# Patient Record
Sex: Female | Born: 1960
Health system: Southern US, Community
[De-identification: ages and names within clinical notes are randomized; demographics above are authoritative.]

## PROBLEM LIST (undated history)

## (undated) DIAGNOSIS — M545 Low back pain, unspecified: Secondary | ICD-10-CM

## (undated) DIAGNOSIS — N943 Premenstrual tension syndrome: Secondary | ICD-10-CM

## (undated) HISTORY — PX: CHOLECYSTECTOMY: SHX55

## (undated) HISTORY — PX: TONSILLECTOMY: SUR1361

## (undated) HISTORY — DX: Low back pain: M54.5

## (undated) HISTORY — DX: Premenstrual tension syndrome: N94.3

## (undated) HISTORY — DX: Low back pain, unspecified: M54.50

## (undated) HISTORY — PX: URETHRAL CYST REMOVAL: SHX5128

---

## 1998-03-19 ENCOUNTER — Emergency Department (HOSPITAL_COMMUNITY): Admission: EM | Admit: 1998-03-19 | Discharge: 1998-03-19 | Payer: Self-pay | Admitting: Emergency Medicine

## 1998-10-08 ENCOUNTER — Encounter: Payer: Self-pay | Admitting: Emergency Medicine

## 1998-10-08 ENCOUNTER — Emergency Department (HOSPITAL_COMMUNITY): Admission: EM | Admit: 1998-10-08 | Discharge: 1998-10-08 | Payer: Self-pay | Admitting: Emergency Medicine

## 2002-04-16 ENCOUNTER — Other Ambulatory Visit: Admission: RE | Admit: 2002-04-16 | Discharge: 2002-04-16 | Payer: Self-pay | Admitting: Obstetrics & Gynecology

## 2003-05-21 ENCOUNTER — Other Ambulatory Visit: Admission: RE | Admit: 2003-05-21 | Discharge: 2003-05-21 | Payer: Self-pay | Admitting: Obstetrics & Gynecology

## 2004-08-05 ENCOUNTER — Other Ambulatory Visit: Admission: RE | Admit: 2004-08-05 | Discharge: 2004-08-05 | Payer: Self-pay | Admitting: Obstetrics & Gynecology

## 2005-04-07 ENCOUNTER — Encounter: Admission: RE | Admit: 2005-04-07 | Discharge: 2005-04-07 | Payer: Self-pay | Admitting: Obstetrics & Gynecology

## 2006-08-03 ENCOUNTER — Encounter: Admission: RE | Admit: 2006-08-03 | Discharge: 2006-08-03 | Payer: Self-pay | Admitting: Internal Medicine

## 2011-07-13 ENCOUNTER — Ambulatory Visit (AMBULATORY_SURGERY_CENTER): Payer: BC Managed Care – PPO

## 2011-07-13 ENCOUNTER — Encounter: Payer: Self-pay | Admitting: Internal Medicine

## 2011-07-13 VITALS — Ht 63.0 in | Wt 166.7 lb

## 2011-07-13 DIAGNOSIS — Z1211 Encounter for screening for malignant neoplasm of colon: Secondary | ICD-10-CM

## 2011-07-13 DIAGNOSIS — K921 Melena: Secondary | ICD-10-CM

## 2011-07-13 MED ORDER — PEG-KCL-NACL-NASULF-NA ASC-C 100 G PO SOLR: 1.0000 | Freq: Once | ORAL | Status: AC

## 2011-07-27 ENCOUNTER — Encounter: Payer: Self-pay | Admitting: Internal Medicine

## 2011-08-04 ENCOUNTER — Encounter: Payer: Self-pay | Admitting: Internal Medicine

## 2011-08-04 ENCOUNTER — Ambulatory Visit (AMBULATORY_SURGERY_CENTER): Payer: BC Managed Care – PPO | Admitting: Internal Medicine

## 2011-08-04 VITALS — BP 133/80 | HR 71 | Temp 99.7°F | Resp 20 | Ht 63.0 in | Wt 166.0 lb

## 2011-08-04 DIAGNOSIS — Z1211 Encounter for screening for malignant neoplasm of colon: Secondary | ICD-10-CM

## 2011-08-04 DIAGNOSIS — K921 Melena: Secondary | ICD-10-CM

## 2011-08-04 DIAGNOSIS — J982 Interstitial emphysema: Secondary | ICD-10-CM

## 2011-08-04 MED ORDER — SODIUM CHLORIDE 0.9 % IV SOLN: 500.0000 mL | INTRAVENOUS | Status: DC

## 2011-08-04 NOTE — Patient Instructions (Addendum)
Internal hemorrhoids, otherwise normal exam  High fiber diet  Recall colonoscopy in 10 yearsYOU HAD AN ENDOSCOPIC PROCEDURE TODAY AT THE [facility]: Refer to the procedure report that was given to you for any specific questions about what was found during the examination.  If the procedure report does not answer your questions, please call your gastroenterologist to clarify.  If you requested that your care partner not be given the details of your procedure findings, then the procedure report has been included in a sealed envelope for you to review at your convenience later.  YOU SHOULD EXPECT: Some feelings of bloating in the abdomen. Passage of more gas than usual.  Walking can help get rid of the air that was put into your GI tract during the procedure and reduce the bloating. If you had a lower endoscopy (such as a colonoscopy or flexible sigmoidoscopy) you may notice spotting of blood in your stool or on the toilet paper. If you underwent a bowel prep for your procedure, then you may not have a normal bowel movement for a few days.  DIET: Your first meal following the procedure should be a light meal and then it is ok to progress to your normal diet.  A half-sandwich or bowl of soup is an example of a good first meal.  Heavy or fried foods are harder to digest and may make you feel nauseous or bloated.  Likewise meals heavy in dairy and vegetables can cause extra gas to form and this can also increase the bloating.  Drink plenty of fluids but you should avoid alcoholic beverages for 24 hours.  ACTIVITY: Your care partner should take you home directly after the procedure.  You should plan to take it easy, moving slowly for the rest of the day.  You can resume normal activity the day after the procedure however you should NOT DRIVE or use heavy machinery for 24 hours (because of the sedation medicines used during the test).    SYMPTOMS TO REPORT IMMEDIATELY: A gastroenterologist can be  reached at any hour.  During normal business hours, 8:30 AM to 5:00 PM Monday through Friday, call [phone].  After hours and on weekends, please call the GI answering service at [phone] who will take a message and have the physician on call contact you.   Following lower endoscopy (colonoscopy or flexible sigmoidoscopy):  Excessive amounts of blood in the stool  Significant tenderness or worsening of abdominal pains  Swelling of the abdomen that is new, acute  Fever of 100F or higher   FOLLOW UP: If any biopsies were taken you will be contacted by phone or by letter within the next 1-3 weeks.  Call your gastroenterologist if you have not heard about the biopsies in 3 weeks.  Our staff will call the home number listed on your records the next business day following your procedure to check on you and address any questions or concerns that you may have at that time regarding the information given to you following your procedure. This is a courtesy call and so if there is no answer at the home number and we have not heard from you through the emergency physician on call, we will assume that you have returned to your regular daily activities without incident.  SIGNATURES/CONFIDENTIALITY: You and/or your care partner have signed paperwork which will be entered into your electronic medical record.  These signatures attest to the fact that that the information above on your After Visit  Summary has been reviewed and is understood.  Full responsibility of the confidentiality of this discharge information lies with you and/or your care-partner.  

## 2011-08-04 NOTE — Progress Notes (Signed)
Patient did not experience any of the following events: a burn prior to discharge; a fall within the facility; wrong site/side/patient/procedure/implant event; or a hospital transfer or hospital admission upon discharge from the facility. (G8907) Patient did not have preoperative order for IV antibiotic SSI prophylaxis. (G8918)  

## 2011-08-04 NOTE — Op Note (Signed)
[facility] [address] [place], [place]  [address]  COLONOSCOPY PROCEDURE REPORT  PATIENT:  Madeline Arias, Madeline Arias  MR#:  [id number] BIRTHDATE:  Sep 15, 1960, 50 yrs. old  GENDER:  female ENDOSCOPIST:  Hedwig Morton. Juanda Chance, MD REF. BY:  Geoffry Paradise, M.D. PROCEDURE DATE:  08/04/2011 PROCEDURE:  Colonoscopy 19147 ASA CLASS:  Class I INDICATIONS:  colorectal cancer screening, average risk, rectal bleeding MEDICATIONS:   MAC sedation, administered by CRNA, Fentanyl 200 mg  DESCRIPTION OF PROCEDURE:   After the risks and benefits and of the procedure were explained, informed consent was obtained. Digital rectal exam was performed and revealed no rectal masses. The LB CF-[id number] endoscope was introduced through the anus and advanced to the cecum, which was identified by both the appendix and ileocecal valve.  The quality of the prep was good, using MoviPrep.  The instrument was then slowly withdrawn as the colon was fully examined. <<PROCEDUREIMAGES>>  FINDINGS:  Internal Hemorrhoids were found. small int (see image4). hems  This was otherwise a normal examination of the colon (see image3, image2, and image1).   Retroflexed views in the rectum revealed no abnormalities.    The scope was then withdrawn from the patient and the procedure completed.  COMPLICATIONS:  None ENDOSCOPIC IMPRESSION: 1) Internal hemorrhoids 2) Otherwise normal examination RECOMMENDATIONS: 1) High fiber diet.  REPEAT EXAM:  In 10 year(s) for.  ______________________________ Hedwig Morton. Juanda Chance, MD  CC:  Varney Baas, MD  n. Rosalie DoctorHedwig Morton. Jerrin Recore at 08/04/2011 12:20 PM  [place], [phone]

## 2011-08-05 ENCOUNTER — Telehealth: Payer: Self-pay | Admitting: *Deleted

## 2011-08-05 NOTE — Telephone Encounter (Signed)
  Follow up Call-  Call back number 08/04/2011  Post procedure Call Back phone  # [phone]  Permission to leave phone message Yes     Patient questions:  Do you have a fever, pain , or abdominal swelling? no Pain Score  0 *  Have you tolerated food without any problems? yes  Have you been able to return to your normal activities? yes  Do you have any questions about your discharge instructions: Diet   no Medications  no Follow up visit  no  Do you have questions or concerns about your Care? no  Actions: * If pain score is 4 or above: No action needed, pain <4.

## 2012-07-04 ENCOUNTER — Other Ambulatory Visit: Payer: Self-pay | Admitting: Obstetrics & Gynecology

## 2012-07-04 DIAGNOSIS — R928 Other abnormal and inconclusive findings on diagnostic imaging of breast: Secondary | ICD-10-CM

## 2012-07-11 ENCOUNTER — Ambulatory Visit
Admission: RE | Admit: 2012-07-11 | Discharge: 2012-07-11 | Disposition: A | Discharge status: Home / Self Care | Payer: BC Managed Care – PPO | Source: Ambulatory Visit | Attending: Obstetrics & Gynecology | Admitting: Obstetrics & Gynecology

## 2012-07-11 DIAGNOSIS — R928 Other abnormal and inconclusive findings on diagnostic imaging of breast: Secondary | ICD-10-CM

## 2015-09-19 DIAGNOSIS — E559 Vitamin D deficiency, unspecified: Secondary | ICD-10-CM | POA: Diagnosis not present

## 2015-09-19 DIAGNOSIS — K219 Gastro-esophageal reflux disease without esophagitis: Secondary | ICD-10-CM | POA: Diagnosis not present

## 2015-09-19 DIAGNOSIS — R0602 Shortness of breath: Secondary | ICD-10-CM | POA: Diagnosis not present

## 2015-09-19 DIAGNOSIS — R5383 Other fatigue: Secondary | ICD-10-CM | POA: Diagnosis not present

## 2015-10-20 DIAGNOSIS — Z01419 Encounter for gynecological examination (general) (routine) without abnormal findings: Secondary | ICD-10-CM | POA: Diagnosis not present

## 2015-10-20 DIAGNOSIS — Z6828 Body mass index (BMI) 28.0-28.9, adult: Secondary | ICD-10-CM | POA: Diagnosis not present

## 2015-10-28 DIAGNOSIS — N390 Urinary tract infection, site not specified: Secondary | ICD-10-CM | POA: Diagnosis not present

## 2015-10-28 DIAGNOSIS — N760 Acute vaginitis: Secondary | ICD-10-CM | POA: Diagnosis not present

## 2015-11-13 DIAGNOSIS — Z1231 Encounter for screening mammogram for malignant neoplasm of breast: Secondary | ICD-10-CM | POA: Diagnosis not present

## 2016-07-14 DIAGNOSIS — Z1329 Encounter for screening for other suspected endocrine disorder: Secondary | ICD-10-CM | POA: Diagnosis not present

## 2016-07-14 DIAGNOSIS — Z780 Asymptomatic menopausal state: Secondary | ICD-10-CM | POA: Diagnosis not present

## 2016-07-14 DIAGNOSIS — B373 Candidiasis of vulva and vagina: Secondary | ICD-10-CM | POA: Diagnosis not present

## 2016-07-15 DIAGNOSIS — H40013 Open angle with borderline findings, low risk, bilateral: Secondary | ICD-10-CM | POA: Diagnosis not present

## 2016-07-15 DIAGNOSIS — H524 Presbyopia: Secondary | ICD-10-CM | POA: Diagnosis not present

## 2016-07-15 DIAGNOSIS — H10413 Chronic giant papillary conjunctivitis, bilateral: Secondary | ICD-10-CM | POA: Diagnosis not present

## 2016-07-15 DIAGNOSIS — H2513 Age-related nuclear cataract, bilateral: Secondary | ICD-10-CM | POA: Diagnosis not present

## 2016-07-27 DIAGNOSIS — E559 Vitamin D deficiency, unspecified: Secondary | ICD-10-CM | POA: Diagnosis not present

## 2016-07-27 DIAGNOSIS — E784 Other hyperlipidemia: Secondary | ICD-10-CM | POA: Diagnosis not present

## 2016-07-27 DIAGNOSIS — Z0000 Encounter for general adult medical examination without abnormal findings: Secondary | ICD-10-CM | POA: Diagnosis not present

## 2016-08-02 DIAGNOSIS — Z0000 Encounter for general adult medical examination without abnormal findings: Secondary | ICD-10-CM | POA: Diagnosis not present

## 2016-08-02 DIAGNOSIS — E663 Overweight: Secondary | ICD-10-CM | POA: Diagnosis not present

## 2016-08-02 DIAGNOSIS — R5381 Other malaise: Secondary | ICD-10-CM | POA: Diagnosis not present

## 2016-08-02 DIAGNOSIS — Z1389 Encounter for screening for other disorder: Secondary | ICD-10-CM | POA: Diagnosis not present

## 2016-08-02 DIAGNOSIS — M62830 Muscle spasm of back: Secondary | ICD-10-CM | POA: Diagnosis not present

## 2016-08-02 DIAGNOSIS — E559 Vitamin D deficiency, unspecified: Secondary | ICD-10-CM | POA: Diagnosis not present

## 2016-08-05 ENCOUNTER — Other Ambulatory Visit: Payer: Self-pay | Admitting: Internal Medicine

## 2016-08-05 DIAGNOSIS — R10816 Epigastric abdominal tenderness: Secondary | ICD-10-CM

## 2016-08-10 ENCOUNTER — Ambulatory Visit
Admission: RE | Admit: 2016-08-10 | Discharge: 2016-08-10 | Disposition: A | Discharge status: Home / Self Care | Payer: BLUE CROSS/BLUE SHIELD | Source: Ambulatory Visit | Attending: Internal Medicine | Admitting: Internal Medicine

## 2016-08-10 DIAGNOSIS — R10816 Epigastric abdominal tenderness: Secondary | ICD-10-CM

## 2016-08-10 DIAGNOSIS — R10819 Abdominal tenderness, unspecified site: Secondary | ICD-10-CM | POA: Diagnosis not present

## 2016-11-22 DIAGNOSIS — Z6827 Body mass index (BMI) 27.0-27.9, adult: Secondary | ICD-10-CM | POA: Diagnosis not present

## 2016-11-22 DIAGNOSIS — Z01419 Encounter for gynecological examination (general) (routine) without abnormal findings: Secondary | ICD-10-CM | POA: Diagnosis not present

## 2016-11-22 DIAGNOSIS — Z780 Asymptomatic menopausal state: Secondary | ICD-10-CM | POA: Diagnosis not present

## 2016-11-22 DIAGNOSIS — Z1231 Encounter for screening mammogram for malignant neoplasm of breast: Secondary | ICD-10-CM | POA: Diagnosis not present

## 2017-01-20 DIAGNOSIS — H2513 Age-related nuclear cataract, bilateral: Secondary | ICD-10-CM | POA: Diagnosis not present

## 2017-01-20 DIAGNOSIS — H40013 Open angle with borderline findings, low risk, bilateral: Secondary | ICD-10-CM | POA: Diagnosis not present

## 2017-01-20 DIAGNOSIS — H10413 Chronic giant papillary conjunctivitis, bilateral: Secondary | ICD-10-CM | POA: Diagnosis not present

## 2017-06-08 DIAGNOSIS — N390 Urinary tract infection, site not specified: Secondary | ICD-10-CM | POA: Diagnosis not present

## 2017-06-08 DIAGNOSIS — N760 Acute vaginitis: Secondary | ICD-10-CM | POA: Diagnosis not present

## 2017-07-14 DIAGNOSIS — N390 Urinary tract infection, site not specified: Secondary | ICD-10-CM | POA: Diagnosis not present

## 2017-07-14 DIAGNOSIS — N760 Acute vaginitis: Secondary | ICD-10-CM | POA: Diagnosis not present

## 2017-08-01 DIAGNOSIS — E7849 Other hyperlipidemia: Secondary | ICD-10-CM | POA: Diagnosis not present

## 2017-08-01 DIAGNOSIS — E559 Vitamin D deficiency, unspecified: Secondary | ICD-10-CM | POA: Diagnosis not present

## 2017-08-01 DIAGNOSIS — Z0000 Encounter for general adult medical examination without abnormal findings: Secondary | ICD-10-CM | POA: Diagnosis not present

## 2017-08-08 DIAGNOSIS — M62830 Muscle spasm of back: Secondary | ICD-10-CM | POA: Diagnosis not present

## 2017-08-08 DIAGNOSIS — Z1389 Encounter for screening for other disorder: Secondary | ICD-10-CM | POA: Diagnosis not present

## 2017-08-08 DIAGNOSIS — E559 Vitamin D deficiency, unspecified: Secondary | ICD-10-CM | POA: Diagnosis not present

## 2017-08-08 DIAGNOSIS — Z0000 Encounter for general adult medical examination without abnormal findings: Secondary | ICD-10-CM | POA: Diagnosis not present

## 2017-08-08 DIAGNOSIS — E663 Overweight: Secondary | ICD-10-CM | POA: Diagnosis not present

## 2017-08-08 DIAGNOSIS — E7849 Other hyperlipidemia: Secondary | ICD-10-CM | POA: Diagnosis not present

## 2017-08-11 DIAGNOSIS — H40013 Open angle with borderline findings, low risk, bilateral: Secondary | ICD-10-CM | POA: Diagnosis not present

## 2017-08-11 DIAGNOSIS — H10413 Chronic giant papillary conjunctivitis, bilateral: Secondary | ICD-10-CM | POA: Diagnosis not present

## 2017-08-11 DIAGNOSIS — H2513 Age-related nuclear cataract, bilateral: Secondary | ICD-10-CM | POA: Diagnosis not present

## 2017-08-22 DIAGNOSIS — J029 Acute pharyngitis, unspecified: Secondary | ICD-10-CM | POA: Diagnosis not present

## 2017-08-22 DIAGNOSIS — Z6827 Body mass index (BMI) 27.0-27.9, adult: Secondary | ICD-10-CM | POA: Diagnosis not present

## 2017-08-22 DIAGNOSIS — J069 Acute upper respiratory infection, unspecified: Secondary | ICD-10-CM | POA: Diagnosis not present

## 2017-11-08 DIAGNOSIS — N368 Other specified disorders of urethra: Secondary | ICD-10-CM | POA: Diagnosis not present

## 2017-11-28 DIAGNOSIS — Z01419 Encounter for gynecological examination (general) (routine) without abnormal findings: Secondary | ICD-10-CM | POA: Diagnosis not present

## 2017-11-28 DIAGNOSIS — Z6827 Body mass index (BMI) 27.0-27.9, adult: Secondary | ICD-10-CM | POA: Diagnosis not present

## 2017-11-28 DIAGNOSIS — Z1231 Encounter for screening mammogram for malignant neoplasm of breast: Secondary | ICD-10-CM | POA: Diagnosis not present

## 2017-12-28 DIAGNOSIS — M79641 Pain in right hand: Secondary | ICD-10-CM | POA: Diagnosis not present

## 2018-01-30 DIAGNOSIS — H40013 Open angle with borderline findings, low risk, bilateral: Secondary | ICD-10-CM | POA: Diagnosis not present

## 2018-01-30 DIAGNOSIS — H10413 Chronic giant papillary conjunctivitis, bilateral: Secondary | ICD-10-CM | POA: Diagnosis not present

## 2018-01-30 DIAGNOSIS — H2513 Age-related nuclear cataract, bilateral: Secondary | ICD-10-CM | POA: Diagnosis not present

## 2018-08-07 DIAGNOSIS — E7849 Other hyperlipidemia: Secondary | ICD-10-CM | POA: Diagnosis not present

## 2018-08-07 DIAGNOSIS — E559 Vitamin D deficiency, unspecified: Secondary | ICD-10-CM | POA: Diagnosis not present

## 2018-08-07 DIAGNOSIS — Z0000 Encounter for general adult medical examination without abnormal findings: Secondary | ICD-10-CM | POA: Diagnosis not present

## 2018-08-14 DIAGNOSIS — E663 Overweight: Secondary | ICD-10-CM | POA: Diagnosis not present

## 2018-08-14 DIAGNOSIS — M1990 Unspecified osteoarthritis, unspecified site: Secondary | ICD-10-CM | POA: Diagnosis not present

## 2018-08-14 DIAGNOSIS — Z1331 Encounter for screening for depression: Secondary | ICD-10-CM | POA: Diagnosis not present

## 2018-08-14 DIAGNOSIS — E785 Hyperlipidemia, unspecified: Secondary | ICD-10-CM | POA: Diagnosis not present

## 2018-08-14 DIAGNOSIS — Z0000 Encounter for general adult medical examination without abnormal findings: Secondary | ICD-10-CM | POA: Diagnosis not present

## 2018-11-13 DIAGNOSIS — H2513 Age-related nuclear cataract, bilateral: Secondary | ICD-10-CM | POA: Diagnosis not present

## 2018-11-13 DIAGNOSIS — H40053 Ocular hypertension, bilateral: Secondary | ICD-10-CM | POA: Diagnosis not present

## 2018-11-13 DIAGNOSIS — H40023 Open angle with borderline findings, high risk, bilateral: Secondary | ICD-10-CM | POA: Diagnosis not present

## 2018-11-13 DIAGNOSIS — H10413 Chronic giant papillary conjunctivitis, bilateral: Secondary | ICD-10-CM | POA: Diagnosis not present

## 2019-01-15 DIAGNOSIS — Z01419 Encounter for gynecological examination (general) (routine) without abnormal findings: Secondary | ICD-10-CM | POA: Diagnosis not present

## 2019-01-15 DIAGNOSIS — Z1231 Encounter for screening mammogram for malignant neoplasm of breast: Secondary | ICD-10-CM | POA: Diagnosis not present

## 2019-01-15 DIAGNOSIS — Z6829 Body mass index (BMI) 29.0-29.9, adult: Secondary | ICD-10-CM | POA: Diagnosis not present

## 2019-01-18 DIAGNOSIS — R0602 Shortness of breath: Secondary | ICD-10-CM | POA: Diagnosis not present

## 2019-01-18 DIAGNOSIS — E785 Hyperlipidemia, unspecified: Secondary | ICD-10-CM | POA: Diagnosis not present

## 2019-01-18 DIAGNOSIS — R0789 Other chest pain: Secondary | ICD-10-CM | POA: Diagnosis not present

## 2019-01-18 DIAGNOSIS — E663 Overweight: Secondary | ICD-10-CM | POA: Diagnosis not present

## 2019-01-25 ENCOUNTER — Other Ambulatory Visit: Payer: Self-pay | Admitting: Internal Medicine

## 2019-01-25 DIAGNOSIS — Z0000 Encounter for general adult medical examination without abnormal findings: Secondary | ICD-10-CM

## 2019-01-25 DIAGNOSIS — E785 Hyperlipidemia, unspecified: Secondary | ICD-10-CM

## 2019-01-27 DIAGNOSIS — Z23 Encounter for immunization: Secondary | ICD-10-CM | POA: Diagnosis not present

## 2019-01-31 ENCOUNTER — Ambulatory Visit
Admission: RE | Admit: 2019-01-31 | Discharge: 2019-01-31 | Disposition: A | Discharge status: Home / Self Care | Payer: No Typology Code available for payment source | Source: Ambulatory Visit | Attending: Internal Medicine | Admitting: Internal Medicine

## 2019-01-31 DIAGNOSIS — Z0000 Encounter for general adult medical examination without abnormal findings: Secondary | ICD-10-CM

## 2019-01-31 DIAGNOSIS — E785 Hyperlipidemia, unspecified: Secondary | ICD-10-CM

## 2019-05-03 DIAGNOSIS — H40053 Ocular hypertension, bilateral: Secondary | ICD-10-CM | POA: Diagnosis not present

## 2019-05-03 DIAGNOSIS — H2513 Age-related nuclear cataract, bilateral: Secondary | ICD-10-CM | POA: Diagnosis not present

## 2019-05-03 DIAGNOSIS — H40023 Open angle with borderline findings, high risk, bilateral: Secondary | ICD-10-CM | POA: Diagnosis not present

## 2019-05-03 DIAGNOSIS — H10413 Chronic giant papillary conjunctivitis, bilateral: Secondary | ICD-10-CM | POA: Diagnosis not present

## 2019-07-20 DIAGNOSIS — Z23 Encounter for immunization: Secondary | ICD-10-CM | POA: Diagnosis not present

## 2019-08-10 DIAGNOSIS — Z23 Encounter for immunization: Secondary | ICD-10-CM | POA: Diagnosis not present

## 2019-09-07 DIAGNOSIS — N760 Acute vaginitis: Secondary | ICD-10-CM | POA: Diagnosis not present

## 2019-09-07 DIAGNOSIS — N951 Menopausal and female climacteric states: Secondary | ICD-10-CM | POA: Diagnosis not present

## 2019-09-07 DIAGNOSIS — N939 Abnormal uterine and vaginal bleeding, unspecified: Secondary | ICD-10-CM | POA: Diagnosis not present

## 2019-12-10 DIAGNOSIS — H40053 Ocular hypertension, bilateral: Secondary | ICD-10-CM | POA: Diagnosis not present

## 2019-12-10 DIAGNOSIS — H2513 Age-related nuclear cataract, bilateral: Secondary | ICD-10-CM | POA: Diagnosis not present

## 2019-12-10 DIAGNOSIS — H40023 Open angle with borderline findings, high risk, bilateral: Secondary | ICD-10-CM | POA: Diagnosis not present

## 2019-12-10 DIAGNOSIS — H10413 Chronic giant papillary conjunctivitis, bilateral: Secondary | ICD-10-CM | POA: Diagnosis not present

## 2019-12-26 DIAGNOSIS — Z20828 Contact with and (suspected) exposure to other viral communicable diseases: Secondary | ICD-10-CM | POA: Diagnosis not present

## 2020-01-17 DIAGNOSIS — Z01419 Encounter for gynecological examination (general) (routine) without abnormal findings: Secondary | ICD-10-CM | POA: Diagnosis not present

## 2020-01-17 DIAGNOSIS — Z6830 Body mass index (BMI) 30.0-30.9, adult: Secondary | ICD-10-CM | POA: Diagnosis not present

## 2020-01-25 DIAGNOSIS — Z1231 Encounter for screening mammogram for malignant neoplasm of breast: Secondary | ICD-10-CM | POA: Diagnosis not present

## 2020-01-31 DIAGNOSIS — R1310 Dysphagia, unspecified: Secondary | ICD-10-CM | POA: Diagnosis not present

## 2020-02-04 DIAGNOSIS — E785 Hyperlipidemia, unspecified: Secondary | ICD-10-CM | POA: Diagnosis not present

## 2020-02-04 DIAGNOSIS — E559 Vitamin D deficiency, unspecified: Secondary | ICD-10-CM | POA: Diagnosis not present

## 2020-06-03 DIAGNOSIS — Z03818 Encounter for observation for suspected exposure to other biological agents ruled out: Secondary | ICD-10-CM | POA: Diagnosis not present

## 2020-06-10 DIAGNOSIS — Z03818 Encounter for observation for suspected exposure to other biological agents ruled out: Secondary | ICD-10-CM | POA: Diagnosis not present

## 2020-07-03 DIAGNOSIS — L601 Onycholysis: Secondary | ICD-10-CM | POA: Diagnosis not present

## 2020-07-09 ENCOUNTER — Ambulatory Visit
Admission: RE | Admit: 2020-07-09 | Discharge: 2020-07-09 | Disposition: A | Discharge status: Home / Self Care | Payer: Self-pay | Source: Ambulatory Visit | Attending: Internal Medicine | Admitting: Internal Medicine

## 2020-07-09 ENCOUNTER — Other Ambulatory Visit: Payer: Self-pay | Admitting: Internal Medicine

## 2020-07-09 ENCOUNTER — Other Ambulatory Visit: Payer: Self-pay

## 2020-07-09 ENCOUNTER — Ambulatory Visit
Admission: RE | Admit: 2020-07-09 | Discharge: 2020-07-09 | Disposition: A | Discharge status: Home / Self Care | Payer: BC Managed Care – PPO | Source: Ambulatory Visit | Attending: Internal Medicine | Admitting: Internal Medicine

## 2020-07-09 DIAGNOSIS — U099 Post covid-19 condition, unspecified: Secondary | ICD-10-CM | POA: Diagnosis not present

## 2020-07-09 DIAGNOSIS — G4453 Primary thunderclap headache: Secondary | ICD-10-CM | POA: Diagnosis not present

## 2020-07-09 DIAGNOSIS — I771 Stricture of artery: Secondary | ICD-10-CM | POA: Diagnosis not present

## 2020-07-09 DIAGNOSIS — Q283 Other malformations of cerebral vessels: Secondary | ICD-10-CM | POA: Diagnosis not present

## 2020-07-09 MED ORDER — IOPAMIDOL (ISOVUE-370) INJECTION 76%
75.0000 mL | Freq: Once | INTRAVENOUS | Status: AC | PRN
  Administered 2020-07-09: 75 mL via INTRAVENOUS

## 2020-08-04 DIAGNOSIS — J069 Acute upper respiratory infection, unspecified: Secondary | ICD-10-CM | POA: Diagnosis not present

## 2020-08-04 DIAGNOSIS — R059 Cough, unspecified: Secondary | ICD-10-CM | POA: Diagnosis not present

## 2020-08-18 DIAGNOSIS — H40023 Open angle with borderline findings, high risk, bilateral: Secondary | ICD-10-CM | POA: Diagnosis not present

## 2020-08-18 DIAGNOSIS — H2513 Age-related nuclear cataract, bilateral: Secondary | ICD-10-CM | POA: Diagnosis not present

## 2020-08-18 DIAGNOSIS — H40053 Ocular hypertension, bilateral: Secondary | ICD-10-CM | POA: Diagnosis not present

## 2020-08-18 DIAGNOSIS — H10413 Chronic giant papillary conjunctivitis, bilateral: Secondary | ICD-10-CM | POA: Diagnosis not present

## 2020-10-18 IMAGING — CT CT HEART SCORING
3 series · 13 of 20 positions shown, 15 images · non-contrast
Comparison: None.

CLINICAL DATA: Family history of heart disease.

EXAM:
CT HEART FOR CALCIUM SCORING
TECHNIQUE: CT heart was performed using prospective ECG gating.
A non-contrast exam for calcium scoring was performed.
Note that this exam targets the heart and the chest was not imaged
in its entirety.

[Series 2: calcium scoring 2.00 qr36 bestdiast 68% · axial · 0.31mm/px · z∈[+1655,+1709]mm · 3 of 69 slices shown]
[im 14/69  vessel]
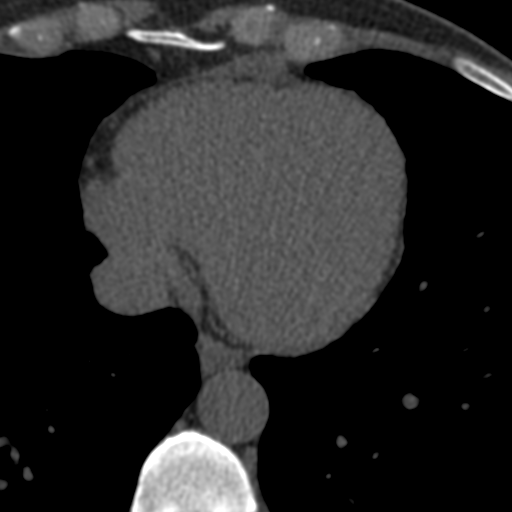
[im 28/69  vessel]
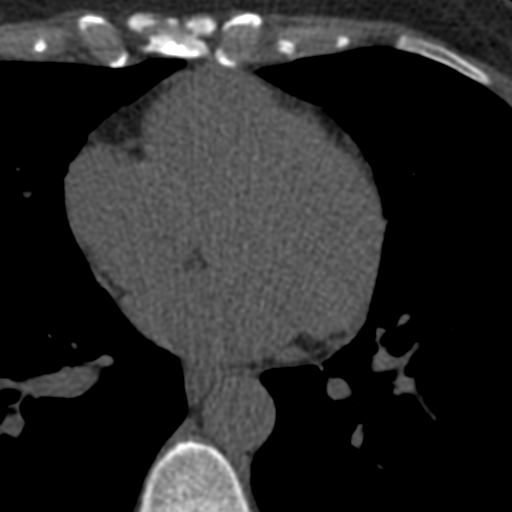
[im 41/69  vessel]
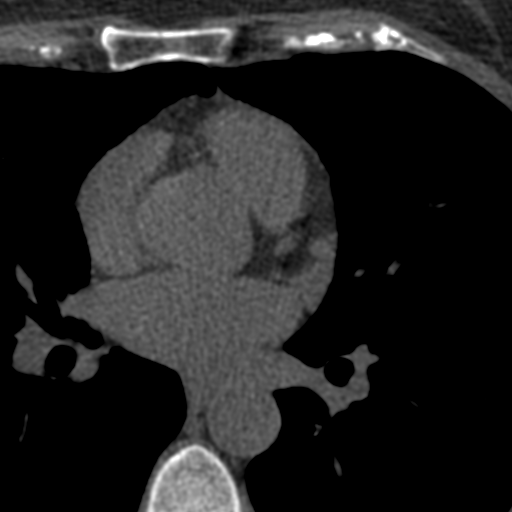

[Series 3: calcium scoring 2.00 br40 bestdiast 68% fov · axial · 0.56mm/px · z∈[+1652,+1740]mm · 5 of 68 slices shown, 7 images]
[im 12/68  vessel]
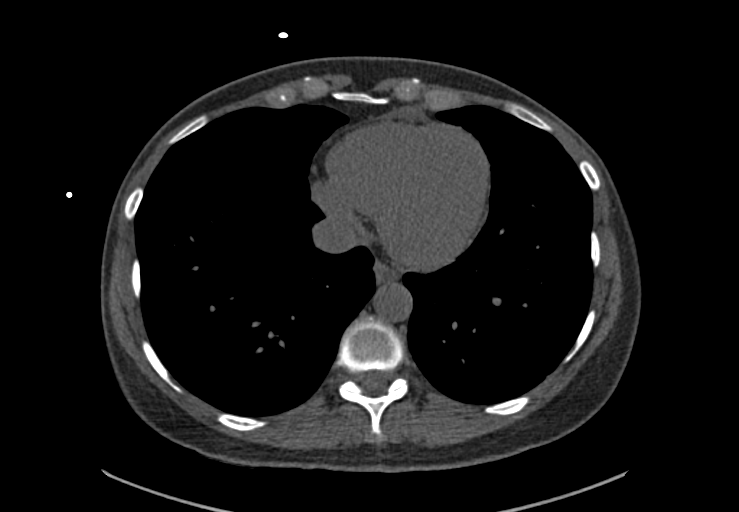
[im 12/68  lung]
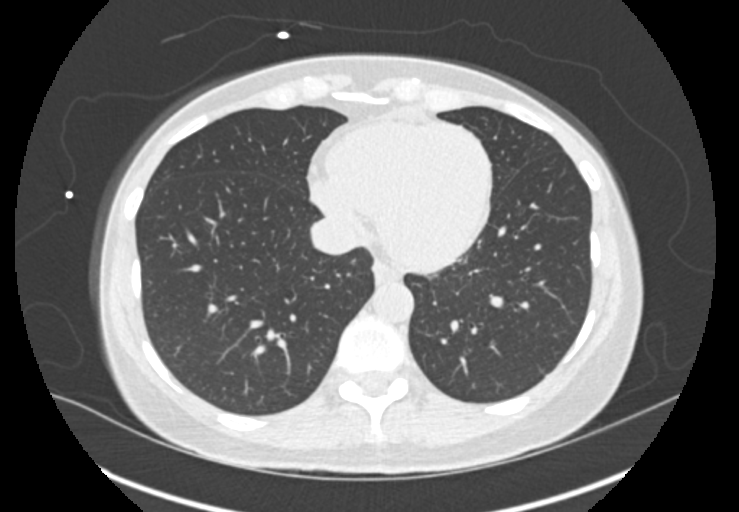
[im 23/68  vessel]
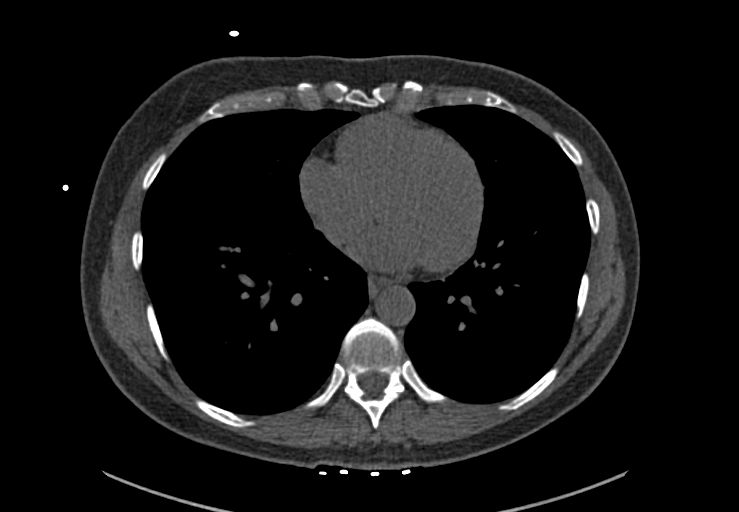
[im 34/68  vessel]
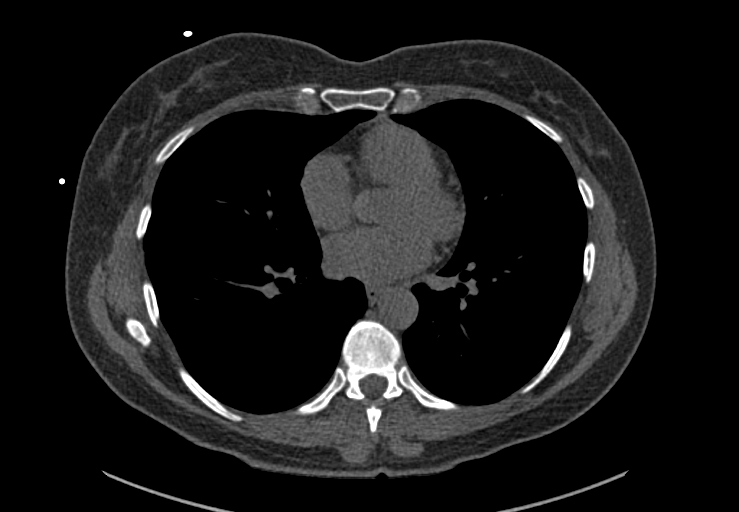
[im 45/68  vessel]
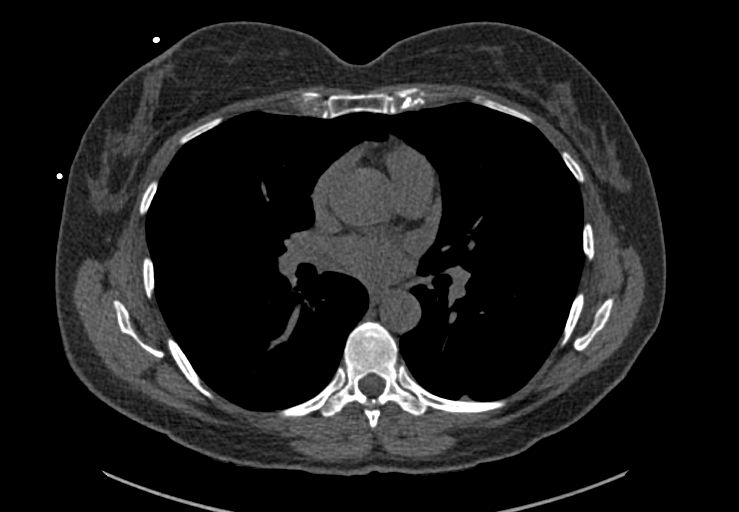
[im 56/68  vessel]
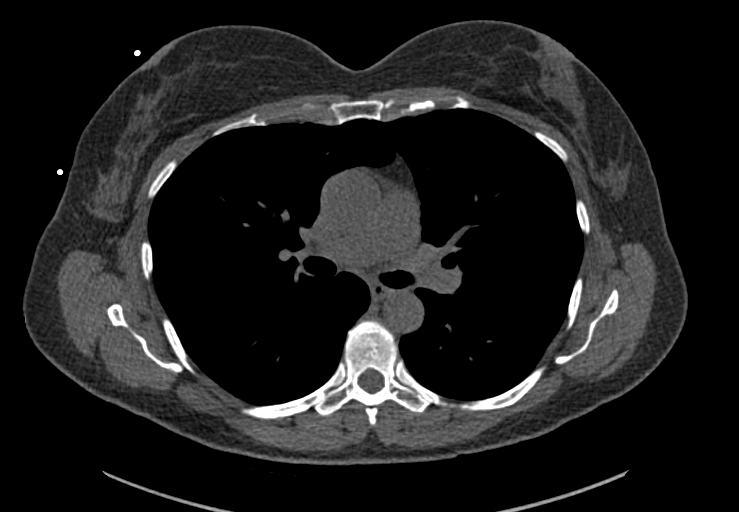
[im 56/68  lung]
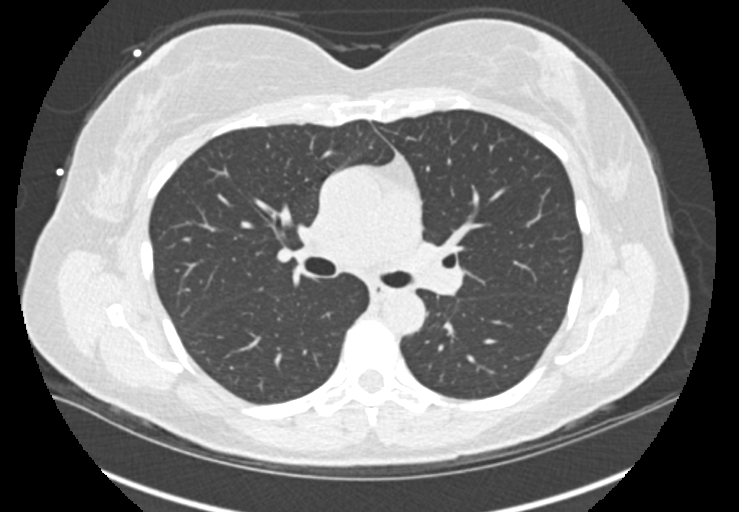

[Series 9: calcium scoring 2.00 br60 bestdiast 68% fov · axial · 0.56mm/px · z∈[+1652,+1740]mm · 5 of 68 slices shown]
[im 12/68  vessel]
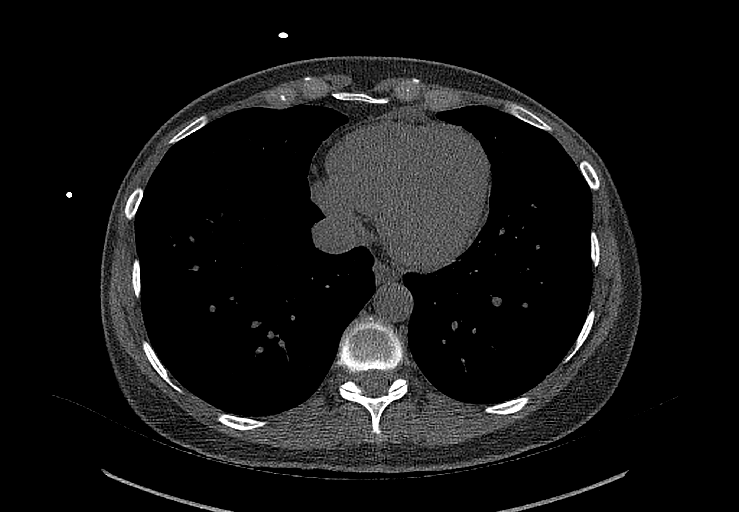
[im 23/68  vessel]
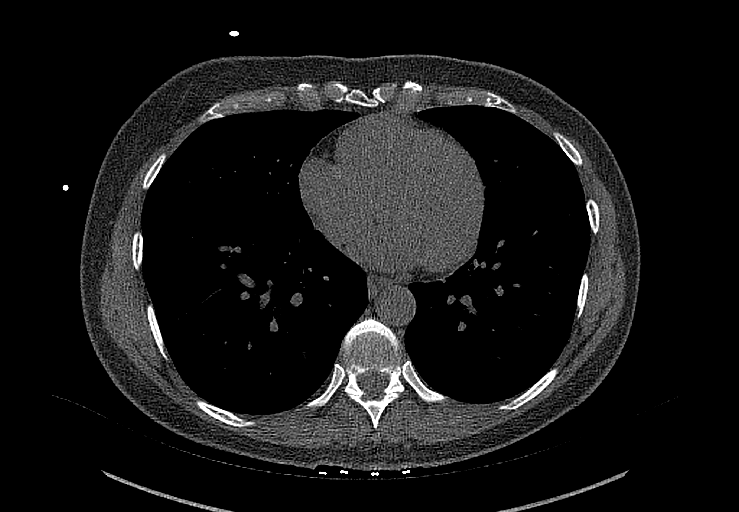
[im 34/68  vessel]
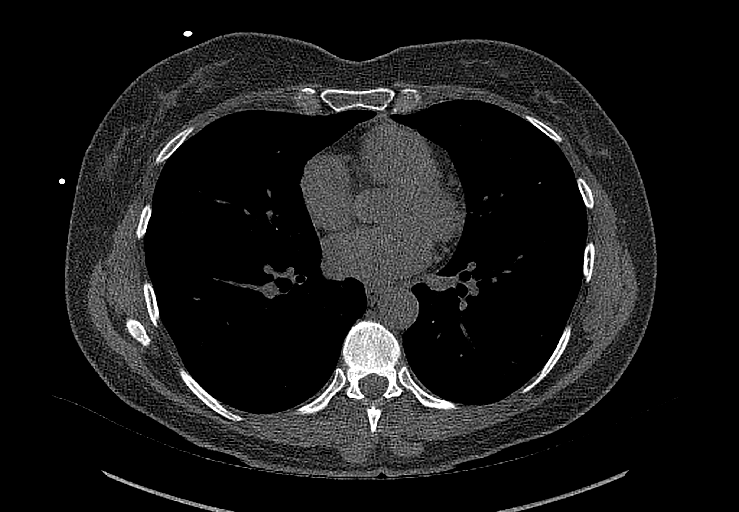
[im 45/68  vessel]
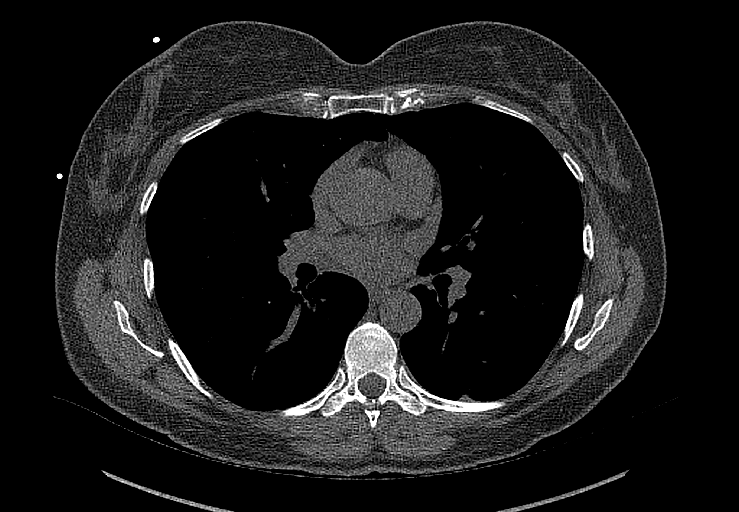
[im 56/68  vessel]
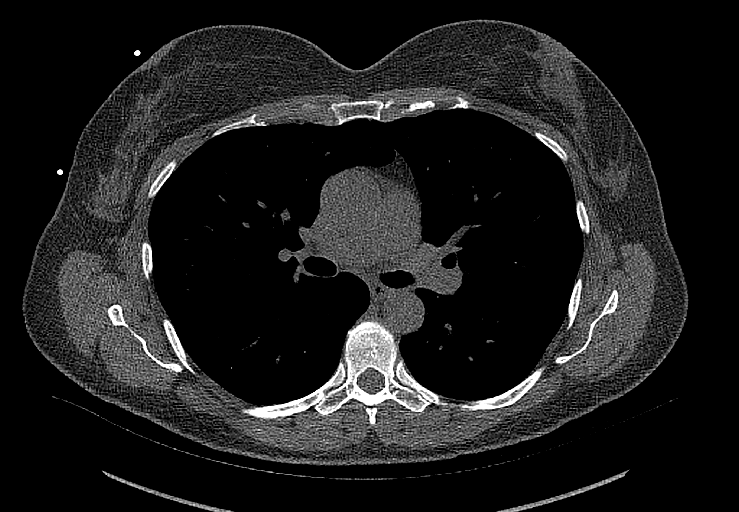

[13 of 20 positions shown; findings below may reference images not displayed]

FINDINGS: Technical quality: Good.

CORONARY CALCIUM

Total Agatston Score: 0-no coronary calcium identified.

OTHER FINDINGS:

Cardiovascular: Aortic atherosclerosis. Normal heart size, without
pericardial effusion. Trace anterior pericardial thickening.

Mediastinum/Nodes: No imaged thoracic adenopathy.

Lungs/Pleura: No imaged pleural fluid. Minimal left lower lobe
scarring.

Upper Abdomen: Normal imaged portions of the liver.

Musculoskeletal: No acute osseous abnormality.
IMPRESSION: 1. No coronary calcium identified.
2.  Aortic Atherosclerosis ([id number]).

## 2021-04-02 DIAGNOSIS — E559 Vitamin D deficiency, unspecified: Secondary | ICD-10-CM | POA: Diagnosis not present

## 2021-04-02 DIAGNOSIS — H6591 Unspecified nonsuppurative otitis media, right ear: Secondary | ICD-10-CM | POA: Diagnosis not present

## 2021-04-13 ENCOUNTER — Other Ambulatory Visit: Payer: Self-pay | Admitting: Internal Medicine

## 2021-04-13 DIAGNOSIS — I773 Arterial fibromuscular dysplasia: Secondary | ICD-10-CM

## 2021-05-04 DIAGNOSIS — Z6829 Body mass index (BMI) 29.0-29.9, adult: Secondary | ICD-10-CM | POA: Diagnosis not present

## 2021-05-04 DIAGNOSIS — Z01419 Encounter for gynecological examination (general) (routine) without abnormal findings: Secondary | ICD-10-CM | POA: Diagnosis not present

## 2021-05-04 DIAGNOSIS — Z1231 Encounter for screening mammogram for malignant neoplasm of breast: Secondary | ICD-10-CM | POA: Diagnosis not present

## 2021-05-21 DIAGNOSIS — E785 Hyperlipidemia, unspecified: Secondary | ICD-10-CM | POA: Diagnosis not present

## 2021-05-27 DIAGNOSIS — Z1339 Encounter for screening examination for other mental health and behavioral disorders: Secondary | ICD-10-CM | POA: Diagnosis not present

## 2021-05-27 DIAGNOSIS — K219 Gastro-esophageal reflux disease without esophagitis: Secondary | ICD-10-CM | POA: Diagnosis not present

## 2021-05-27 DIAGNOSIS — Z0000 Encounter for general adult medical examination without abnormal findings: Secondary | ICD-10-CM | POA: Diagnosis not present

## 2021-05-27 DIAGNOSIS — Z1331 Encounter for screening for depression: Secondary | ICD-10-CM | POA: Diagnosis not present

## 2021-07-10 DIAGNOSIS — A609 Anogenital herpesviral infection, unspecified: Secondary | ICD-10-CM | POA: Diagnosis not present

## 2021-07-10 DIAGNOSIS — R319 Hematuria, unspecified: Secondary | ICD-10-CM | POA: Diagnosis not present

## 2021-07-10 DIAGNOSIS — N760 Acute vaginitis: Secondary | ICD-10-CM | POA: Diagnosis not present

## 2021-07-17 DIAGNOSIS — H903 Sensorineural hearing loss, bilateral: Secondary | ICD-10-CM | POA: Diagnosis not present

## 2021-07-17 DIAGNOSIS — H9201 Otalgia, right ear: Secondary | ICD-10-CM | POA: Diagnosis not present

## 2021-07-21 ENCOUNTER — Other Ambulatory Visit: Payer: BC Managed Care – PPO

## 2021-08-12 ENCOUNTER — Encounter: Payer: Self-pay | Admitting: Internal Medicine

## 2021-08-27 DIAGNOSIS — H1045 Other chronic allergic conjunctivitis: Secondary | ICD-10-CM | POA: Diagnosis not present

## 2021-08-27 DIAGNOSIS — H2513 Age-related nuclear cataract, bilateral: Secondary | ICD-10-CM | POA: Diagnosis not present

## 2021-08-27 DIAGNOSIS — H40023 Open angle with borderline findings, high risk, bilateral: Secondary | ICD-10-CM | POA: Diagnosis not present

## 2021-08-27 DIAGNOSIS — H524 Presbyopia: Secondary | ICD-10-CM | POA: Diagnosis not present

## 2021-09-16 DIAGNOSIS — R202 Paresthesia of skin: Secondary | ICD-10-CM | POA: Diagnosis not present

## 2021-09-16 DIAGNOSIS — R55 Syncope and collapse: Secondary | ICD-10-CM | POA: Diagnosis not present

## 2021-09-16 DIAGNOSIS — Z1339 Encounter for screening examination for other mental health and behavioral disorders: Secondary | ICD-10-CM | POA: Diagnosis not present

## 2021-09-23 ENCOUNTER — Encounter: Payer: Self-pay | Admitting: Cardiovascular Disease

## 2021-09-23 ENCOUNTER — Ambulatory Visit (INDEPENDENT_AMBULATORY_CARE_PROVIDER_SITE_OTHER): Payer: BC Managed Care – PPO | Admitting: Cardiovascular Disease

## 2021-09-23 ENCOUNTER — Ambulatory Visit (INDEPENDENT_AMBULATORY_CARE_PROVIDER_SITE_OTHER): Payer: BC Managed Care – PPO

## 2021-09-23 DIAGNOSIS — R42 Dizziness and giddiness: Secondary | ICD-10-CM

## 2021-09-23 DIAGNOSIS — Z8249 Family history of ischemic heart disease and other diseases of the circulatory system: Secondary | ICD-10-CM

## 2021-09-23 DIAGNOSIS — E782 Mixed hyperlipidemia: Secondary | ICD-10-CM | POA: Diagnosis not present

## 2021-09-23 DIAGNOSIS — E785 Hyperlipidemia, unspecified: Secondary | ICD-10-CM | POA: Insufficient documentation

## 2021-09-23 NOTE — Assessment & Plan Note (Signed)
Family history of heart disease with father who had CABG at age 61 and mother who had stenting at age 35.  Patient did have coronary calcium score done 3 years ago which was 0.  She denies chest pain. ?

## 2021-09-23 NOTE — Progress Notes (Unsigned)
Enrolled patient for a 14 day Zio XT  monitor to be mailed to patients home  °

## 2021-09-23 NOTE — Assessment & Plan Note (Signed)
History of hyperlipidemia on rosuvastatin 2 times a week, previously on atorvastatin which she did not tolerate.  Her most recent lipid profile performed 05/21/2021 revealed total cholesterol 165, LDL 153 and HDL 102.  She did have a coronary calcium score performed 01/31/2019 which was 0.  This is acceptable for primary prevention. ?

## 2021-09-23 NOTE — Patient Instructions (Signed)
Medication Instructions:  ?Your physician recommends that you continue on your current medications as directed. Please refer to the Current Medication list given to you today. ? ?*If you need a refill on your cardiac medications before your next appointment, please call your pharmacy* ? ? ?Testing/Procedures: ?Your physician has requested that you have an echocardiogram. Echocardiography is a painless test that uses sound waves to create images of your heart. It provides your doctor with information about the size and shape of your heart and how well your heart?s chambers and valves are working. This procedure takes approximately one hour. There are no restrictions for this procedure.  ?Test locations:  ?[facility] ([address] 3rd Floor [place], [place] [address]) ?[facility] ([address] [place], [place] [address])  ? ? ?ZIO XT- Long Term Monitor Instructions ? ?Your physician has requested you wear a ZIO patch monitor for 14 days.  ?This is a single patch monitor. [employer] supplies one patch monitor per enrollment. Additional ?stickers are not available. Please do not apply patch if you will be having a Nuclear Stress Test,  ?Echocardiogram, Cardiac CT, MRI, or Chest Xray during the period you would be wearing the  ?monitor. The patch cannot be worn during these tests. You cannot remove and re-apply the  ?ZIO XT patch monitor.  ?Your ZIO patch monitor will be mailed 3 day USPS to your address on file. It may take 3-5 days  ?to receive your monitor after you have been enrolled.  ?Once you have received your monitor, please review the enclosed instructions. Your monitor  ?has already been registered assigning a specific monitor serial # to you. ? ?Billing and Patient Assistance Program Information ? ?We have supplied [employer] with any of your insurance information on file for billing purposes. ?[employer] offers a sliding scale Patient Assistance Program for patients that do not have  ?insurance, or  whose insurance does not completely cover the cost of the ZIO monitor.  ?You must apply for the Patient Assistance Program to qualify for this discounted rate.  ?To apply, please call [employer] at [phone], select option 4, select option 2, ask to apply for  ?Patient Assistance Program. Meredeth Ide will ask your household income, and how many people  ?are in your household. They will quote your out-of-pocket cost based on that information.  ?[employer] will also be able to set up a 16-month, interest-free payment plan if needed. ? ?Applying the monitor ?  ?Shave hair from upper left chest.  ?Hold abrader disc by orange tab. Rub abrader in 40 strokes over the upper left chest as  ?indicated in your monitor instructions.  ?Clean area with 4 enclosed alcohol pads. Let dry.  ?Apply patch as indicated in monitor instructions. Patch will be placed under collarbone on left  ?side of chest with arrow pointing upward.  ?Rub patch adhesive wings for 2 minutes. Remove white label marked "1". Remove the white  ?label marked "2". Rub patch adhesive wings for 2 additional minutes.  ?While looking in a mirror, press and release button in center of patch. A small green light will  ?flash 3-4 times. This will be your only indicator that the monitor has been turned on.  ?Do not shower for the first 24 hours. You may shower after the first 24 hours.  ?Press the button if you feel a symptom. You will hear a small click. Record Date, Time and  ?Symptom in the Patient Logbook.  ?When you are ready to remove the patch, follow instructions on  the last 2 pages of Patient  ?Logbook. Stick patch monitor onto the last page of Patient Logbook.  ?Place Patient Logbook in the blue and white box. Use locking tab on box and tape box closed  ?securely. The blue and white box has prepaid postage on it. Please place it in the mailbox as  ?soon as possible. Your physician should have your test results approximately 7 days after the  ?monitor has been mailed  back to [facility].  ?Call [facility] at [phone] if you have questions regarding  ?your ZIO XT patch monitor. Call them immediately if you see an orange light blinking on your  ?monitor.  ?If your monitor falls off in less than 4 days, contact our Monitor department at [phone].  ?If your monitor becomes loose or falls off after 4 days call [employer] at [phone] for  ?suggestions on securing your monitor ? ? ?Follow-Up: ?At [facility], you and your health needs are our priority.  As part of our continuing mission to provide you with exceptional heart care, we have created designated Provider Care Teams.  These Care Teams include your primary Cardiologist (physician) and Advanced Practice Providers (APPs -  Physician Assistants and Nurse Practitioners) who all work together to provide you with the care you need, when you need it. ? ?We recommend signing up for the patient portal called "MyChart".  Sign up information is provided on this After Visit Summary.  MyChart is used to connect with patients for Virtual Visits (Telemedicine).  Patients are able to view lab/test results, encounter notes, upcoming appointments, etc.  Non-urgent messages can be sent to your provider as well.   ?To learn more about what you can do with MyChart, go to [email or web address].   ? ?Your next appointment:   ?6-8 week(s) ? ?The format for your next appointment:   ?In Person ? ?Provider:   ?Nanetta Batty, MD ?

## 2021-09-23 NOTE — Progress Notes (Signed)
? ? ? ?09/23/2021 ?Madeline Arias   ?Madeline Arias  ?[id number] ? ?Primary Physician Madeline Paradise, MD ?Primary Cardiologist: Madeline Gess MD Madeline Arias, [place] ? ?HPI:  Madeline Arias is a 61 y.o. thin-appearing married Caucasian female mother of 2 children, whose husband Madeline Arias is also a patient of mine.  She was referred by Dr. Lorain Arias for evaluation of atypical symptoms that may be either cardiovascular or neurologic.  Her only cardiovascular risk factors are treated hyperlipidemia and family history.  She is never had a heart attack or stroke.  She had the first episode in January which will awaken her from sleep and was associated with slurred speech and weakness.  She had another episode similar to this a week ago that lasted 45 minutes with similar symptoms like her vision was fading and her speech was slowed.  She specifically denies chest pain or shortness of breath.  Her symptoms sound more neurologic to me although I cannot rule out arrhythmogenic cause.  Her primary care provider is pursuing a neurologic work-up. ? ? ?Current Meds  ?Medication Sig  ? Cholecalciferol 25 MCG (1000 UT) tablet Take by mouth.  ? Coenzyme Q10 100 MG capsule 1 tab qd Oral  ? diazepam (VALIUM) 10 MG tablet As needed for MRI & Dentist  ? estradiol (ESTRACE) 0.1 MG/GM vaginal cream as directed Vaginal  ? ibuprofen (ADVIL) 200 MG tablet take one by mouth prn Oral  ? latanoprost (XALATAN) 0.005 % ophthalmic solution 1 drop at bedtime.  ? methocarbamol (ROBAXIN) 500 MG tablet Take 500 mg by mouth every 8 (eight) hours as needed.  ? Multiple Vitamins-Calcium (ONE-A-DAY WOMENS PO) Take by mouth daily. One A Day gummi MVI- Take 2 daily  ? pantoprazole (PROTONIX) 40 MG tablet TAKE 1 TABLET BY MOUTH EVERY DAY FOR 90 DAYS  ? progesterone (PROMETRIUM) 100 MG capsule SMARTSIG:1 Capsule(s) By Mouth Every Evening  ? rosuvastatin (CRESTOR) 20 MG tablet Take by mouth.  ?  ? ?Allergies  ?Allergen Reactions  ? Sulfa Antibiotics Hives   ? ? ?Social History  ? ?Socioeconomic History  ? Marital status: Married  ?  Spouse name: Not on file  ? Number of children: Not on file  ? Years of education: Not on file  ? Highest education level: Not on file  ?Occupational History  ? Not on file  ?Tobacco Use  ? Smoking status: Never  ? Smokeless tobacco: Never  ?Substance and Sexual Activity  ? Alcohol use: Yes  ?  Alcohol/week: 2.0 standard drinks  ?  Types: 2 drink(s) per week  ? Drug use: No  ? Sexual activity: Not on file  ?Other Topics Concern  ? Not on file  ?Social History Narrative  ? Not on file  ? ?Social Determinants of Health  ? ?Financial Resource Strain: Not on file  ?Food Insecurity: Not on file  ?Transportation Needs: Not on file  ?Physical Activity: Not on file  ?Stress: Not on file  ?Social Connections: Not on file  ?Intimate Partner Violence: Not on file  ?  ? ?Review of Systems: ?General: negative for chills, fever, night sweats or weight changes.  ?Cardiovascular: negative for chest pain, dyspnea on exertion, edema, orthopnea, palpitations, paroxysmal nocturnal dyspnea or shortness of breath ?Dermatological: negative for rash ?Respiratory: negative for cough or wheezing ?Urologic: negative for hematuria ?Abdominal: negative for nausea, vomiting, diarrhea, bright red blood per rectum, melena, or hematemesis ?Neurologic: negative for visual changes, syncope, or dizziness ?All other systems reviewed and are  otherwise negative except as noted above. ? ? ? ?Blood pressure 122/86, pulse 62, height 5\' 3"  (1.6 m), weight 173 lb 6.4 oz (78.7 kg), last menstrual period 06/18/2011, SpO2 96 %.  ?General appearance: alert and no distress ?Neck: no adenopathy, no carotid bruit, no JVD, supple, symmetrical, trachea midline, and thyroid not enlarged, symmetric, no tenderness/mass/nodules ?Lungs: clear to auscultation bilaterally ?Heart: regular rate and rhythm, S1, S2 normal, no murmur, click, rub or gallop ?Extremities: extremities normal, atraumatic, no  cyanosis or edema ?Pulses: 2+ and symmetric ?Skin: Skin color, texture, turgor normal. No rashes or lesions ?Neurologic: Grossly normal ? ?EKG sinus rhythm at 62 without ST or T wave changes.  Personally reviewed this EKG. ? ?ASSESSMENT AND PLAN:  ? ?Hyperlipidemia ?History of hyperlipidemia on rosuvastatin 2 times a week, previously on atorvastatin which she did not tolerate.  Her most recent lipid profile performed 05/21/2021 revealed total cholesterol 165, LDL 153 and HDL 102.  She did have a coronary calcium score performed 01/31/2019 which was 0.  This is acceptable for primary prevention. ? ?Family history of heart disease ?Family history of heart disease with father who had CABG at age 39 and mother who had stenting at age 71.  Patient did have coronary calcium score done 3 years ago which was 0.  She denies chest pain. ? ?Dizziness ?Ms. Arias was referred to me by her primary care physician, Dr. Rennis Arias, for unusual symptoms of dizziness that occur in the evening hours and awaken her from sleep and are associated with some slowed speech speech and great vision and weakness.  This lasted up to 45 minutes.  She had another episode a week ago and several short episodes in between.  They do not sound cardiovascular but more likely neurologic.  I am going to get a 2D echo to eczema patch to further evaluate. ? ? ? ? ?Madeline Childes MD FACP,FACC,FAHA, FSCAI ?09/23/2021 ?10:48 AM ?

## 2021-09-23 NOTE — Assessment & Plan Note (Signed)
Madeline Arias was referred to me by her primary care physician, Dr. Lorain Childes, for unusual symptoms of dizziness that occur in the evening hours and awaken her from sleep and are associated with some slowed speech speech and great vision and weakness.  This lasted up to 45 minutes.  She had another episode a week ago and several short episodes in between.  They do not sound cardiovascular but more likely neurologic.  I am going to get a 2D echo to eczema patch to further evaluate. ?

## 2021-09-23 NOTE — Addendum Note (Signed)
Addended by: Bernita Buffy on: 09/23/2021 10:55 AM ? ? Modules accepted: Orders ? ?

## 2021-09-28 DIAGNOSIS — R55 Syncope and collapse: Secondary | ICD-10-CM | POA: Diagnosis not present

## 2021-10-02 ENCOUNTER — Ambulatory Visit (HOSPITAL_BASED_OUTPATIENT_CLINIC_OR_DEPARTMENT_OTHER): Payer: BC Managed Care – PPO

## 2021-10-02 DIAGNOSIS — Z8249 Family history of ischemic heart disease and other diseases of the circulatory system: Secondary | ICD-10-CM

## 2021-10-02 DIAGNOSIS — E782 Mixed hyperlipidemia: Secondary | ICD-10-CM

## 2021-10-02 DIAGNOSIS — R42 Dizziness and giddiness: Secondary | ICD-10-CM

## 2021-10-02 LAB — ECHOCARDIOGRAM COMPLETE
AR max vel: 2.39 cm2
AV Area VTI: 2.47 cm2
AV Area mean vel: 2.53 cm2
AV Mean grad: 3 mmHg
AV Peak grad: 7.6 mmHg
AV Vena cont: 0.19 cm
Ao pk vel: 1.38 m/s
Area-P 1/2: 3.03 cm2
Calc EF: 67.4 %
S' Lateral: 3.21 cm
Single Plane A2C EF: 70.6 %
Single Plane A4C EF: 64.9 %

## 2021-10-15 DIAGNOSIS — R42 Dizziness and giddiness: Secondary | ICD-10-CM

## 2021-10-15 DIAGNOSIS — E782 Mixed hyperlipidemia: Secondary | ICD-10-CM

## 2021-10-15 DIAGNOSIS — Z8249 Family history of ischemic heart disease and other diseases of the circulatory system: Secondary | ICD-10-CM | POA: Diagnosis not present

## 2021-11-05 DIAGNOSIS — R42 Dizziness and giddiness: Secondary | ICD-10-CM | POA: Diagnosis not present

## 2021-11-11 ENCOUNTER — Encounter: Payer: Self-pay | Admitting: Cardiovascular Disease

## 2021-11-11 ENCOUNTER — Ambulatory Visit (INDEPENDENT_AMBULATORY_CARE_PROVIDER_SITE_OTHER): Payer: BC Managed Care – PPO | Admitting: Cardiovascular Disease

## 2021-11-11 VITALS — BP 110/70 | HR 71 | Ht 63.0 in | Wt 178.6 lb

## 2021-11-11 DIAGNOSIS — R42 Dizziness and giddiness: Secondary | ICD-10-CM

## 2021-11-11 NOTE — Progress Notes (Signed)
Ms. Bumgardner returns today for follow-up of her outpatient diagnostic tests performed in evaluation of dizziness.  Her event monitor showed occasional PACs, PVCs and short runs of SVT.  Her 2D echo was entirely normal.  I do not think these have not any way contributing to her symptoms.  She also complains of vertigo.  She is scheduled to see a neurologist in the upcoming week.  I will see her back as needed.  Runell Gess, M.D., FACP, [facility], [facility] [address] [place], [place]  [address]  [phone] 11/11/2021 11:55 AM

## 2021-11-11 NOTE — Patient Instructions (Signed)

## 2021-11-12 DIAGNOSIS — Z461 Encounter for fitting and adjustment of hearing aid: Secondary | ICD-10-CM | POA: Diagnosis not present

## 2021-11-12 DIAGNOSIS — H903 Sensorineural hearing loss, bilateral: Secondary | ICD-10-CM | POA: Diagnosis not present

## 2021-11-27 ENCOUNTER — Encounter: Payer: Self-pay | Admitting: Neurology

## 2021-11-30 DIAGNOSIS — R1031 Right lower quadrant pain: Secondary | ICD-10-CM | POA: Diagnosis not present

## 2021-11-30 DIAGNOSIS — N950 Postmenopausal bleeding: Secondary | ICD-10-CM | POA: Diagnosis not present

## 2021-12-16 ENCOUNTER — Ambulatory Visit: Payer: BC Managed Care – PPO | Admitting: Neurology

## 2021-12-22 ENCOUNTER — Encounter: Payer: Self-pay | Admitting: Neurology

## 2021-12-22 ENCOUNTER — Ambulatory Visit (INDEPENDENT_AMBULATORY_CARE_PROVIDER_SITE_OTHER): Payer: BC Managed Care – PPO | Admitting: Neurology

## 2021-12-22 VITALS — BP 119/80 | HR 80 | Ht 63.0 in | Wt 176.0 lb

## 2021-12-22 DIAGNOSIS — N950 Postmenopausal bleeding: Secondary | ICD-10-CM | POA: Diagnosis not present

## 2021-12-22 DIAGNOSIS — R29818 Other symptoms and signs involving the nervous system: Secondary | ICD-10-CM | POA: Diagnosis not present
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
## 2021-12-22 NOTE — Patient Instructions (Signed)
Good to meet you.  Schedule EEG. Our office will call with results, if normal, we will proceed with the 72-hour home EEG  2. Keep a calendar of your symptoms  3. Follow-up after tests, call for any changes

## 2022-02-15 ENCOUNTER — Telehealth: Payer: Self-pay | Admitting: Neurology

## 2022-02-15 NOTE — Telephone Encounter (Signed)
Pt called an informed that Good to hear she is feeling better. Continue to monitor symptoms, if they recur, would proceed with EEG

## 2022-02-15 NOTE — Telephone Encounter (Signed)
Good to hear she is feeling better. Continue to monitor symptoms, if they recur, would proceed with EEG, thanks

## 2022-02-15 NOTE — Telephone Encounter (Signed)
Pt said she weaned herself off of her estrogen. Her and aquino had talked about getting an EEG done, but she doesn't think she needs it now. She feels much better now since being off the medicine.

## 2022-03-04 DIAGNOSIS — H2513 Age-related nuclear cataract, bilateral: Secondary | ICD-10-CM | POA: Diagnosis not present

## 2022-03-04 DIAGNOSIS — H40023 Open angle with borderline findings, high risk, bilateral: Secondary | ICD-10-CM | POA: Diagnosis not present

## 2022-03-04 DIAGNOSIS — H1045 Other chronic allergic conjunctivitis: Secondary | ICD-10-CM | POA: Diagnosis not present

## 2023-02-10 ENCOUNTER — Ambulatory Visit (INDEPENDENT_AMBULATORY_CARE_PROVIDER_SITE_OTHER): Payer: No Typology Code available for payment source | Admitting: Orthopaedic Surgery

## 2023-02-10 ENCOUNTER — Other Ambulatory Visit (INDEPENDENT_AMBULATORY_CARE_PROVIDER_SITE_OTHER): Payer: No Typology Code available for payment source

## 2023-02-10 DIAGNOSIS — G8929 Other chronic pain: Secondary | ICD-10-CM | POA: Diagnosis not present

## 2023-02-10 DIAGNOSIS — M25561 Pain in right knee: Secondary | ICD-10-CM | POA: Diagnosis not present

## 2023-02-10 NOTE — Progress Notes (Addendum)
Office Visit Note   Patient: Madeline Arias           Date of Birth: 01/24/1961           MRN: [id number] Visit Date: 02/10/2023              Requested by: Geoffry Paradise, MD [address] [place],  [place] [address] PCP: Geoffry Paradise, MD   Assessment & Plan: Visit Diagnoses:  1. Chronic pain of right knee     Plan: [name] is a 62 year old female with right knee pain with suspected lateral meniscus tear.  She is reporting frequent symptoms of locking and giving way.  We will obtain MRI to assess for this.  I will message her through MyChart after the MRI.  Follow-Up Instructions: No follow-ups on file.   Orders:  Orders Placed This Encounter  Procedures   XR KNEE 3 VIEW RIGHT   MR Knee Right w/o contrast   No orders of the defined types were placed in this encounter.     Procedures: No procedures performed   Clinical Data: No additional findings.   Subjective: Chief Complaint  Patient presents with   Right Knee - Pain    HPI [name] is a 62 year old female who has had right knee pain August 26 of this year.  She was trying to get ready for a 5K and she started hurting the next day.  Then she had a couple events where she had to use a lot of stairs while moving pain.  She has had locking and giving way and night pain.  She has pain with sit to stand transition.  There is something moving inside the knee.  Occasionally wakes up with pain. Review of Systems  Constitutional: Negative.   HENT: Negative.    Eyes: Negative.   Respiratory: Negative.    Cardiovascular: Negative.   Endocrine: Negative.   Musculoskeletal: Negative.   Neurological: Negative.   Hematological: Negative.   Psychiatric/Behavioral: Negative.    All other systems reviewed and are negative.    Objective: Vital Signs: LMP 06/18/2011   Physical Exam Vitals and nursing note reviewed.  Constitutional:      Appearance: She is well-developed.  HENT:     Head: Atraumatic.     Nose:  Nose normal.  Eyes:     Extraocular Movements: Extraocular movements intact.  Cardiovascular:     Pulses: Normal pulses.  Pulmonary:     Effort: Pulmonary effort is normal.  Abdominal:     Palpations: Abdomen is soft.  Musculoskeletal:     Cervical back: Neck supple.  Skin:    General: Skin is warm.     Capillary Refill: Capillary refill takes less than 2 seconds.  Neurological:     Mental Status: She is alert. Mental status is at baseline.  Psychiatric:        Behavior: Behavior normal.        Thought Content: Thought content normal.        Judgment: Judgment normal.     Ortho Exam Exam of the right knee shows a small joint effusion.  Lateral joint line tenderness.  Collaterals and cruciates are stable.  Normal range of motion.  Pain with McMurray test along the lateral joint line. Specialty Comments:  No specialty comments available.  Imaging: XR KNEE 3 VIEW RIGHT  Result Date: 02/10/2023 X-rays of the right knee show no acute or structural abnormalities    PMFS History: Patient Active Problem List   Diagnosis Date  Noted   Hyperlipidemia 09/23/2021   Dizziness 09/23/2021   Family history of heart disease 09/23/2021   Past Medical History:  Diagnosis Date   LBP (low back pain)    PMS (premenstrual syndrome)     Family History  Problem Relation Age of Onset   Heart disease Mother    Heart disease Father    High blood pressure Brother     Past Surgical History:  Procedure Laterality Date   CHOLECYSTECTOMY     TONSILLECTOMY     URETHRAL CYST REMOVAL     Social History   Occupational History   Not on file  Tobacco Use   Smoking status: Never   Smokeless tobacco: Never  Substance and Sexual Activity   Alcohol use: Yes    Alcohol/week: 2.0 standard drinks of alcohol    Types: 2 drink(s) per week    Comment: 3 a week liquor   Drug use: No   Sexual activity: Not on file

## 2023-02-20 ENCOUNTER — Inpatient Hospital Stay
Admission: RE | Admit: 2023-02-20 | Discharge: 2023-02-20 | Discharge status: Home / Self Care | Payer: No Typology Code available for payment source | Source: Ambulatory Visit | Attending: Orthopaedic Surgery | Admitting: Orthopaedic Surgery

## 2023-02-20 DIAGNOSIS — G8929 Other chronic pain: Secondary | ICD-10-CM

## 2023-02-21 ENCOUNTER — Other Ambulatory Visit (HOSPITAL_COMMUNITY): Payer: No Typology Code available for payment source

## 2023-03-02 ENCOUNTER — Encounter: Payer: Self-pay | Admitting: Orthopaedic Surgery

## 2023-03-02 ENCOUNTER — Ambulatory Visit: Payer: No Typology Code available for payment source | Admitting: Orthopaedic Surgery

## 2023-03-02 DIAGNOSIS — S83241A Other tear of medial meniscus, current injury, right knee, initial encounter: Secondary | ICD-10-CM | POA: Diagnosis not present

## 2023-03-02 NOTE — Progress Notes (Signed)
   Office Visit Note   Patient: Madeline Arias           Date of Birth: 1960-07-11           MRN: [id number] Visit Date: 03/02/2023              Requested by: Geoffry Paradise, MD [address] [place],  [place] [address] PCP: Geoffry Paradise, MD   Assessment & Plan: Visit Diagnoses:  1. Acute medial meniscus tear of right knee, initial encounter     Plan: MRI of the right knee shows a radial tear of the medial meniscal root.  There is slight extrusion.  The chondral surfaces are unremarkable.  Based on the rapid improvement in her symptoms I did do not recommend surgical treatment.  I think clinically she is doing very well and so she can increase activity as tolerated.  She will be careful with impact activities and squats for another 5 to 6 weeks.  Will see her back as needed.  Follow-Up Instructions: No follow-ups on file.   Orders:  No orders of the defined types were placed in this encounter.  No orders of the defined types were placed in this encounter.     Procedures: No procedures performed   Clinical Data: No additional findings.   Subjective: Chief Complaint  Patient presents with   Right Knee - Follow-up    MRI review    HPI Lynnea returns today to discuss right knee MRI scan.  She symptomatically she feels much better.  She is now around 7 weeks from the injury. Review of Systems   Objective: Vital Signs: LMP 06/18/2011   Physical Exam  Ortho Exam Exam of the right knee is unremarkable. Specialty Comments:  No specialty comments available.  Imaging: No results found.   PMFS History: Patient Active Problem List   Diagnosis Date Noted   Hyperlipidemia 09/23/2021   Dizziness 09/23/2021   Family history of heart disease 09/23/2021   Past Medical History:  Diagnosis Date   LBP (low back pain)    PMS (premenstrual syndrome)     Family History  Problem Relation Age of Onset   Heart disease Mother    Heart disease Father    High blood  pressure Brother     Past Surgical History:  Procedure Laterality Date   CHOLECYSTECTOMY     TONSILLECTOMY     URETHRAL CYST REMOVAL     Social History   Occupational History   Not on file  Tobacco Use   Smoking status: Never   Smokeless tobacco: Never  Substance and Sexual Activity   Alcohol use: Yes    Alcohol/week: 2.0 standard drinks of alcohol    Types: 2 drink(s) per week    Comment: 3 a week liquor   Drug use: No   Sexual activity: Not on file

## 2023-03-30 DIAGNOSIS — J069 Acute upper respiratory infection, unspecified: Secondary | ICD-10-CM | POA: Diagnosis not present

## 2023-03-30 DIAGNOSIS — J029 Acute pharyngitis, unspecified: Secondary | ICD-10-CM | POA: Diagnosis not present

## 2023-03-30 DIAGNOSIS — R051 Acute cough: Secondary | ICD-10-CM | POA: Diagnosis not present

## 2023-05-04 NOTE — Progress Notes (Unsigned)
Office Visit Note   Patient: Madeline Arias           Date of Birth: [birth year]           MRN: [id number] Visit Date: 05/05/2023              Requested by: Geoffry Paradise, MD [address] [place],  [place] [address] PCP: Geoffry Paradise, MD   Assessment & Plan: Visit Diagnoses:  1. Acute medial meniscus tear of right knee, initial encounter     Plan: My impression of her symptoms is either a partially ruptured Baker's cyst or a gastroc strain.  Does not sound like she is reporting symptoms consistent with intra-articular pathology.  I have advised her to give this little bit more time and treat this symptomatically.  Could consider calf compression sleeve.  Follow-Up Instructions: No follow-ups on file.   Orders:  No orders of the defined types were placed in this encounter.  No orders of the defined types were placed in this encounter.     Procedures: No procedures performed   Clinical Data: No additional findings.   Subjective: Chief Complaint  Patient presents with   Right Knee - Follow-up, Pain    HPI Madeline Arias is a 62 year old female here for evaluation of posterior knee pain.  She reports that after the MRI she felt a pop in the back of the knee almost directly centrally.  She has been experiencing start up pain and pain in the back of the knee that radiates down the calf.  Denies any mechanical symptoms.  Reports start up stiffness. Review of Systems  Constitutional: Negative.   HENT: Negative.    Eyes: Negative.   Respiratory: Negative.    Cardiovascular: Negative.   Endocrine: Negative.   Musculoskeletal: Negative.   Neurological: Negative.   Hematological: Negative.   Psychiatric/Behavioral: Negative.    All other systems reviewed and are negative.    Objective: Vital Signs: LMP 06/18/2011   Physical Exam Vitals and nursing note reviewed.  Constitutional:      Appearance: She is well-developed.  HENT:     Head: Normocephalic and atraumatic.   Pulmonary:     Effort: Pulmonary effort is normal.  Abdominal:     Palpations: Abdomen is soft.  Musculoskeletal:     Cervical back: Neck supple.  Skin:    General: Skin is warm.     Capillary Refill: Capillary refill takes less than 2 seconds.  Neurological:     Mental Status: She is alert and oriented to person, place, and time.  Psychiatric:        Behavior: Behavior normal.        Thought Content: Thought content normal.        Judgment: Judgment normal.    Ortho Exam Exam of the right knee shows no joint line tenderness or joint effusion.  Collaterals and cruciates are stable.  Normal range of motion.  Mild tenderness diffusely in the popliteal fossa. Specialty Comments:  No specialty comments available.  Imaging: No results found.   PMFS History: Patient Active Problem List   Diagnosis Date Noted   Hyperlipidemia 09/23/2021   Dizziness 09/23/2021   Family history of heart disease 09/23/2021   Past Medical History:  Diagnosis Date   LBP (low back pain)    PMS (premenstrual syndrome)     Family History  Problem Relation Age of Onset   Heart disease Mother    Heart disease Father    High blood pressure Brother  Past Surgical History:  Procedure Laterality Date   CHOLECYSTECTOMY     TONSILLECTOMY     URETHRAL CYST REMOVAL     Social History   Occupational History   Not on file  Tobacco Use   Smoking status: Never   Smokeless tobacco: Never  Substance and Sexual Activity   Alcohol use: Yes    Alcohol/week: 2.0 standard drinks of alcohol    Types: 2 drink(s) per week    Comment: 3 a week liquor   Drug use: No   Sexual activity: Not on file

## 2023-05-05 ENCOUNTER — Ambulatory Visit: Payer: No Typology Code available for payment source | Admitting: Orthopaedic Surgery

## 2023-05-05 DIAGNOSIS — S83241A Other tear of medial meniscus, current injury, right knee, initial encounter: Secondary | ICD-10-CM | POA: Diagnosis not present

## 2023-06-08 DIAGNOSIS — E559 Vitamin D deficiency, unspecified: Secondary | ICD-10-CM | POA: Diagnosis not present

## 2023-06-08 DIAGNOSIS — E785 Hyperlipidemia, unspecified: Secondary | ICD-10-CM | POA: Diagnosis not present

## 2023-06-14 DIAGNOSIS — H3561 Retinal hemorrhage, right eye: Secondary | ICD-10-CM | POA: Diagnosis not present

## 2023-06-14 DIAGNOSIS — H5315 Visual distortions of shape and size: Secondary | ICD-10-CM | POA: Diagnosis not present

## 2023-06-14 DIAGNOSIS — H2513 Age-related nuclear cataract, bilateral: Secondary | ICD-10-CM | POA: Diagnosis not present

## 2023-06-14 DIAGNOSIS — H524 Presbyopia: Secondary | ICD-10-CM | POA: Diagnosis not present

## 2023-06-14 DIAGNOSIS — H40023 Open angle with borderline findings, high risk, bilateral: Secondary | ICD-10-CM | POA: Diagnosis not present

## 2023-06-15 DIAGNOSIS — R82998 Other abnormal findings in urine: Secondary | ICD-10-CM | POA: Diagnosis not present

## 2023-06-15 DIAGNOSIS — M1990 Unspecified osteoarthritis, unspecified site: Secondary | ICD-10-CM | POA: Diagnosis not present

## 2023-06-15 DIAGNOSIS — Z1331 Encounter for screening for depression: Secondary | ICD-10-CM | POA: Diagnosis not present

## 2023-06-15 DIAGNOSIS — Z0000 Encounter for general adult medical examination without abnormal findings: Secondary | ICD-10-CM | POA: Diagnosis not present

## 2023-07-20 DIAGNOSIS — Z6831 Body mass index (BMI) 31.0-31.9, adult: Secondary | ICD-10-CM | POA: Diagnosis not present

## 2023-07-20 DIAGNOSIS — Z124 Encounter for screening for malignant neoplasm of cervix: Secondary | ICD-10-CM | POA: Diagnosis not present

## 2023-07-20 DIAGNOSIS — Z01419 Encounter for gynecological examination (general) (routine) without abnormal findings: Secondary | ICD-10-CM | POA: Diagnosis not present

## 2023-07-20 DIAGNOSIS — Z1231 Encounter for screening mammogram for malignant neoplasm of breast: Secondary | ICD-10-CM | POA: Diagnosis not present

## 2023-09-12 DIAGNOSIS — R300 Dysuria: Secondary | ICD-10-CM | POA: Diagnosis not present

## 2023-11-29 DIAGNOSIS — R519 Headache, unspecified: Secondary | ICD-10-CM | POA: Diagnosis not present

## 2023-11-29 DIAGNOSIS — H5315 Visual distortions of shape and size: Secondary | ICD-10-CM | POA: Diagnosis not present

## 2024-01-12 DIAGNOSIS — H903 Sensorineural hearing loss, bilateral: Secondary | ICD-10-CM | POA: Diagnosis not present

## 2024-01-12 DIAGNOSIS — Z461 Encounter for fitting and adjustment of hearing aid: Secondary | ICD-10-CM | POA: Diagnosis not present

## 2024-03-19 ENCOUNTER — Encounter: Payer: Self-pay | Admitting: Radiology
